# Patient Record
Sex: Female | Born: 1965 | Race: Black or African American | Hispanic: No | Marital: Married | Smoking: Never smoker
Health system: Southern US, Community
[De-identification: ages and names within clinical notes are randomized; demographics above are authoritative.]

## PROBLEM LIST (undated history)

## (undated) DIAGNOSIS — T7840XA Allergy, unspecified, initial encounter: Secondary | ICD-10-CM

## (undated) DIAGNOSIS — G4730 Sleep apnea, unspecified: Secondary | ICD-10-CM

## (undated) DIAGNOSIS — I10 Essential (primary) hypertension: Secondary | ICD-10-CM

## (undated) DIAGNOSIS — E079 Disorder of thyroid, unspecified: Secondary | ICD-10-CM

## (undated) DIAGNOSIS — N189 Chronic kidney disease, unspecified: Secondary | ICD-10-CM

## (undated) HISTORY — PX: BREAST BIOPSY: SHX20

---

## 2007-06-02 ENCOUNTER — Encounter: Admission: RE | Admit: 2007-06-02 | Discharge: 2007-06-02 | Payer: Self-pay | Admitting: Obstetrics and Gynecology

## 2007-07-08 ENCOUNTER — Encounter (INDEPENDENT_AMBULATORY_CARE_PROVIDER_SITE_OTHER): Payer: Self-pay | Admitting: Obstetrics and Gynecology

## 2007-07-08 ENCOUNTER — Ambulatory Visit (HOSPITAL_COMMUNITY): Admission: RE | Admit: 2007-07-08 | Discharge: 2007-07-08 | Payer: Self-pay | Admitting: Obstetrics and Gynecology

## 2008-06-16 ENCOUNTER — Encounter: Admission: RE | Admit: 2008-06-16 | Discharge: 2008-06-16 | Payer: Self-pay | Admitting: Obstetrics and Gynecology

## 2009-07-31 ENCOUNTER — Encounter: Admission: RE | Admit: 2009-07-31 | Discharge: 2009-07-31 | Payer: Self-pay | Admitting: Obstetrics and Gynecology

## 2010-10-23 ENCOUNTER — Encounter
Admission: RE | Admit: 2010-10-23 | Discharge: 2010-10-23 | Payer: Self-pay | Source: Home / Self Care | Attending: Internal Medicine | Admitting: Internal Medicine

## 2011-02-26 NOTE — Op Note (Signed)
NAMETEIRRA, CARAPIA               ACCOUNT NO.:  [id number]   MEDICAL RECORD NO.:  [id number]          PATIENT TYPE:  AMB   LOCATION:  SDC                           FACILITY:  [facility]   PHYSICIAN:  Carrington Clamp, M.D. DATE OF BIRTH:  04/05/1966   DATE OF PROCEDURE:  07/08/2007  DATE OF DISCHARGE:                               OPERATIVE REPORT   PREOPERATIVE DIAGNOSIS:  Menometrorrhagia.   POSTOPERATIVE DIAGNOSIS:  Menometrorrhagia.   PROCEDURE:  Dilation and curettage with hysteroscopy and NovaSure  ablation.   SURGEON:  Carrington Clamp, M.D.  Assistants:  None.   ANESTHESIA:  LMA.   FINDINGS:  Shaggy endometrium.  A 6-cm cavity with a 4.2 cm width.  The  power for the NovaSure ablation was 139 at 55 seconds.  There was  minimal hysteroscopy deficit, i.e., less than 50 mL.   SPECIMENS:  Uterine curettings.   ESTIMATED BLOOD LOSS:  Minimal.   IV FLUIDS:  1000 mL.   URINE OUTPUT:  Not measured.   COMPLICATIONS:  None.   TECHNIQUE:  After adequate general anesthesia was achieved, the patient  was prepped and draped in the usual sterile fashion in the dorsal  lithotomy position.  A speculum was placed in the vagina after the  bladder had been emptied with a red rubber catheter and the cervix was  grasped with a single-tooth tenaculum.  The cervix dilated up with Shawnie Pons  dilators after the cervix had been measured at 4 cm.  The hysteroscope  was passed into the uterine cavity and the above findings noted.  Both  ostia were seen but no other abnormalities of the cavity were seen.   A vigorous curetting with a sharp curette was performed to obtain as  much tissue as possible.  This was sent to pathology.  The NovaSure  ablation instrument was then seated into the cavity without complication  with the above measurements.  Once the CO2 cavity assessment test was  passed, the NovaSure proceeded without complication for  a power of 139 for 55 seconds.  The hysteroscope was passed  into the  uterine cavity immediately afterwards and indicated good contact of all  areas of the uterus down to the cervix.  All instruments were then  withdrawn from the vagina.  The patient tolerated the procedure well and  was returned to the recovery room in stable condition.      Carrington Clamp, M.D.  Electronically Signed     MH/MEDQ  D:  07/08/2007  T:  07/08/2007  Job:  [id number]

## 2011-07-25 LAB — URINALYSIS, ROUTINE W REFLEX MICROSCOPIC
Bilirubin Urine: NEGATIVE
Glucose, UA: NEGATIVE
Leukocytes, UA: NEGATIVE
Nitrite: NEGATIVE
Urobilinogen, UA: 0.2
pH: 6

## 2011-07-25 LAB — URINE MICROSCOPIC-ADD ON

## 2011-07-25 LAB — CBC
HCT: 35.8 — ABNORMAL LOW
MCV: 76.5 — ABNORMAL LOW
RBC: 4.68
WBC: 8.4

## 2011-11-22 ENCOUNTER — Other Ambulatory Visit: Payer: Self-pay | Admitting: Internal Medicine

## 2011-11-22 DIAGNOSIS — Z1231 Encounter for screening mammogram for malignant neoplasm of breast: Secondary | ICD-10-CM

## 2011-12-10 ENCOUNTER — Ambulatory Visit: Payer: Self-pay

## 2012-12-08 ENCOUNTER — Other Ambulatory Visit: Payer: Self-pay | Admitting: Internal Medicine

## 2012-12-08 DIAGNOSIS — Z1231 Encounter for screening mammogram for malignant neoplasm of breast: Secondary | ICD-10-CM

## 2013-01-11 ENCOUNTER — Ambulatory Visit
Admission: RE | Admit: 2013-01-11 | Discharge: 2013-01-11 | Disposition: A | Discharge status: Home / Self Care | Payer: BC Managed Care – PPO | Source: Ambulatory Visit | Attending: Internal Medicine | Admitting: Internal Medicine

## 2013-01-11 DIAGNOSIS — Z1231 Encounter for screening mammogram for malignant neoplasm of breast: Secondary | ICD-10-CM

## 2013-01-21 ENCOUNTER — Other Ambulatory Visit (HOSPITAL_COMMUNITY)
Admission: RE | Admit: 2013-01-21 | Discharge: 2013-01-21 | Disposition: A | Discharge status: Home / Self Care | Payer: BC Managed Care – PPO | Source: Ambulatory Visit | Attending: Obstetrics and Gynecology | Admitting: Obstetrics and Gynecology

## 2013-01-21 ENCOUNTER — Other Ambulatory Visit: Payer: Self-pay | Admitting: Obstetrics and Gynecology

## 2013-01-21 DIAGNOSIS — Z1151 Encounter for screening for human papillomavirus (HPV): Secondary | ICD-10-CM | POA: Insufficient documentation

## 2013-01-21 DIAGNOSIS — Z01419 Encounter for gynecological examination (general) (routine) without abnormal findings: Secondary | ICD-10-CM | POA: Insufficient documentation

## 2013-01-21 DIAGNOSIS — N760 Acute vaginitis: Secondary | ICD-10-CM | POA: Insufficient documentation

## 2014-01-19 ENCOUNTER — Other Ambulatory Visit: Payer: Self-pay

## 2014-01-19 DIAGNOSIS — Z1231 Encounter for screening mammogram for malignant neoplasm of breast: Secondary | ICD-10-CM

## 2014-02-18 ENCOUNTER — Encounter (INDEPENDENT_AMBULATORY_CARE_PROVIDER_SITE_OTHER): Payer: Self-pay

## 2014-02-18 ENCOUNTER — Ambulatory Visit
Admission: RE | Admit: 2014-02-18 | Discharge: 2014-02-18 | Disposition: A | Discharge status: Home / Self Care | Payer: BC Managed Care – PPO | Source: Ambulatory Visit

## 2014-02-18 DIAGNOSIS — Z1231 Encounter for screening mammogram for malignant neoplasm of breast: Secondary | ICD-10-CM

## 2014-02-21 ENCOUNTER — Other Ambulatory Visit: Payer: Self-pay | Admitting: Internal Medicine

## 2014-02-21 DIAGNOSIS — R928 Other abnormal and inconclusive findings on diagnostic imaging of breast: Secondary | ICD-10-CM

## 2014-03-08 ENCOUNTER — Ambulatory Visit
Admission: RE | Admit: 2014-03-08 | Discharge: 2014-03-08 | Disposition: A | Discharge status: Home / Self Care | Payer: BC Managed Care – PPO | Source: Ambulatory Visit | Attending: Internal Medicine | Admitting: Internal Medicine

## 2014-03-08 DIAGNOSIS — R928 Other abnormal and inconclusive findings on diagnostic imaging of breast: Secondary | ICD-10-CM

## 2015-01-23 ENCOUNTER — Other Ambulatory Visit: Payer: Self-pay

## 2015-01-23 DIAGNOSIS — Z1231 Encounter for screening mammogram for malignant neoplasm of breast: Secondary | ICD-10-CM

## 2015-03-27 ENCOUNTER — Ambulatory Visit
Admission: RE | Admit: 2015-03-27 | Discharge: 2015-03-27 | Disposition: A | Discharge status: Home / Self Care | Payer: BC Managed Care – PPO | Source: Ambulatory Visit

## 2015-03-27 DIAGNOSIS — Z1231 Encounter for screening mammogram for malignant neoplasm of breast: Secondary | ICD-10-CM

## 2016-03-21 ENCOUNTER — Other Ambulatory Visit: Payer: Self-pay | Admitting: Internal Medicine

## 2016-03-21 DIAGNOSIS — Z1231 Encounter for screening mammogram for malignant neoplasm of breast: Secondary | ICD-10-CM

## 2016-04-04 ENCOUNTER — Ambulatory Visit
Admission: RE | Admit: 2016-04-04 | Discharge: 2016-04-04 | Disposition: A | Discharge status: Home / Self Care | Payer: BC Managed Care – PPO | Source: Ambulatory Visit | Attending: Internal Medicine | Admitting: Internal Medicine

## 2016-04-04 DIAGNOSIS — Z1231 Encounter for screening mammogram for malignant neoplasm of breast: Secondary | ICD-10-CM

## 2016-04-05 ENCOUNTER — Ambulatory Visit: Payer: BC Managed Care – PPO

## 2017-04-18 ENCOUNTER — Other Ambulatory Visit: Payer: Self-pay | Admitting: Internal Medicine

## 2017-04-18 DIAGNOSIS — Z1231 Encounter for screening mammogram for malignant neoplasm of breast: Secondary | ICD-10-CM

## 2017-04-23 ENCOUNTER — Ambulatory Visit
Admission: RE | Admit: 2017-04-23 | Discharge: 2017-04-23 | Disposition: A | Discharge status: Home / Self Care | Payer: BC Managed Care – PPO | Source: Ambulatory Visit | Attending: Internal Medicine | Admitting: Internal Medicine

## 2017-04-23 DIAGNOSIS — Z1231 Encounter for screening mammogram for malignant neoplasm of breast: Secondary | ICD-10-CM

## 2019-05-19 ENCOUNTER — Other Ambulatory Visit: Payer: Self-pay

## 2019-05-19 DIAGNOSIS — Z1231 Encounter for screening mammogram for malignant neoplasm of breast: Secondary | ICD-10-CM

## 2019-07-06 ENCOUNTER — Ambulatory Visit
Admission: RE | Admit: 2019-07-06 | Discharge: 2019-07-06 | Disposition: A | Discharge status: Home / Self Care | Payer: BC Managed Care – PPO | Source: Ambulatory Visit | Attending: Family Medicine | Admitting: Family Medicine

## 2019-07-06 ENCOUNTER — Other Ambulatory Visit: Payer: Self-pay

## 2019-07-06 DIAGNOSIS — Z1231 Encounter for screening mammogram for malignant neoplasm of breast: Secondary | ICD-10-CM

## 2019-07-08 ENCOUNTER — Other Ambulatory Visit: Payer: Self-pay | Admitting: Family Medicine

## 2019-07-08 DIAGNOSIS — R928 Other abnormal and inconclusive findings on diagnostic imaging of breast: Secondary | ICD-10-CM

## 2019-07-14 ENCOUNTER — Other Ambulatory Visit: Payer: Self-pay

## 2019-07-14 ENCOUNTER — Ambulatory Visit
Admission: RE | Admit: 2019-07-14 | Discharge: 2019-07-14 | Disposition: A | Discharge status: Home / Self Care | Payer: BC Managed Care – PPO | Source: Ambulatory Visit | Attending: Family Medicine | Admitting: Family Medicine

## 2019-07-14 DIAGNOSIS — R928 Other abnormal and inconclusive findings on diagnostic imaging of breast: Secondary | ICD-10-CM

## 2019-12-10 ENCOUNTER — Emergency Department (HOSPITAL_BASED_OUTPATIENT_CLINIC_OR_DEPARTMENT_OTHER)
Admission: EM | Admit: 2019-12-10 | Discharge: 2019-12-10 | Disposition: A | Discharge status: Home / Self Care | Payer: BC Managed Care – PPO | Attending: Emergency Medicine | Admitting: Emergency Medicine

## 2019-12-10 ENCOUNTER — Encounter (HOSPITAL_BASED_OUTPATIENT_CLINIC_OR_DEPARTMENT_OTHER): Payer: Self-pay | Admitting: Student

## 2019-12-10 ENCOUNTER — Other Ambulatory Visit: Payer: Self-pay

## 2019-12-10 DIAGNOSIS — R102 Pelvic and perineal pain: Secondary | ICD-10-CM | POA: Insufficient documentation

## 2019-12-10 DIAGNOSIS — I129 Hypertensive chronic kidney disease with stage 1 through stage 4 chronic kidney disease, or unspecified chronic kidney disease: Secondary | ICD-10-CM | POA: Diagnosis not present

## 2019-12-10 DIAGNOSIS — M545 Low back pain: Secondary | ICD-10-CM | POA: Diagnosis present

## 2019-12-10 DIAGNOSIS — U071 COVID-19: Secondary | ICD-10-CM

## 2019-12-10 DIAGNOSIS — N189 Chronic kidney disease, unspecified: Secondary | ICD-10-CM | POA: Diagnosis not present

## 2019-12-10 HISTORY — DX: Disorder of thyroid, unspecified: E07.9

## 2019-12-10 HISTORY — DX: Essential (primary) hypertension: I10

## 2019-12-10 HISTORY — DX: Chronic kidney disease, unspecified: N18.9

## 2019-12-10 HISTORY — DX: Sleep apnea, unspecified: G47.30

## 2019-12-10 HISTORY — DX: Allergy, unspecified, initial encounter: T78.40XA

## 2019-12-10 LAB — PREGNANCY, URINE: Preg Test, Ur: NEGATIVE

## 2019-12-10 LAB — CBC WITH DIFFERENTIAL/PLATELET
Abs Immature Granulocytes: 0.03 10*3/uL (ref 0.00–0.07)
Basophils Absolute: 0 10*3/uL (ref 0.0–0.1)
Basophils Relative: 0 %
Eosinophils Absolute: 0.1 10*3/uL (ref 0.0–0.5)
Eosinophils Relative: 2 %
HCT: 44.3 % (ref 36.0–46.0)
Hemoglobin: 15.7 g/dL — ABNORMAL HIGH (ref 12.0–15.0)
Immature Granulocytes: 1 %
Lymphocytes Relative: 32 %
Lymphs Abs: 2 10*3/uL (ref 0.7–4.0)
MCH: 30.5 pg (ref 26.0–34.0)
MCHC: 35.4 g/dL (ref 30.0–36.0)
MCV: 86 fL (ref 80.0–100.0)
Monocytes Absolute: 0.5 10*3/uL (ref 0.1–1.0)
Monocytes Relative: 9 %
Neutro Abs: 3.5 10*3/uL (ref 1.7–7.7)
Neutrophils Relative %: 56 %
Platelets: 254 10*3/uL (ref 150–400)
RBC: 5.15 MIL/uL — ABNORMAL HIGH (ref 3.87–5.11)
RDW: 13 % (ref 11.5–15.5)
WBC: 6.3 10*3/uL (ref 4.0–10.5)
nRBC: 0 % (ref 0.0–0.2)

## 2019-12-10 LAB — SARS CORONAVIRUS 2 AG (30 MIN TAT): SARS Coronavirus 2 Ag: POSITIVE — AB

## 2019-12-10 LAB — URINALYSIS, ROUTINE W REFLEX MICROSCOPIC
Bilirubin Urine: NEGATIVE
Glucose, UA: NEGATIVE mg/dL
Hgb urine dipstick: NEGATIVE
Ketones, ur: NEGATIVE mg/dL
Leukocytes,Ua: NEGATIVE
Nitrite: NEGATIVE
Protein, ur: NEGATIVE mg/dL
Specific Gravity, Urine: 1.015 (ref 1.005–1.030)
pH: 6.5 (ref 5.0–8.0)

## 2019-12-10 LAB — COMPREHENSIVE METABOLIC PANEL
ALT: 31 U/L (ref 0–44)
AST: 32 U/L (ref 15–41)
Albumin: 3.8 g/dL (ref 3.5–5.0)
Alkaline Phosphatase: 77 U/L (ref 38–126)
Anion gap: 11 (ref 5–15)
BUN: 16 mg/dL (ref 6–20)
CO2: 25 mmol/L (ref 22–32)
Calcium: 8.7 mg/dL — ABNORMAL LOW (ref 8.9–10.3)
Chloride: 100 mmol/L (ref 98–111)
Creatinine, Ser: 1.11 mg/dL — ABNORMAL HIGH (ref 0.44–1.00)
GFR calc Af Amer: >60 mL/min (ref >60)
GFR calc non Af Amer: 57 mL/min — ABNORMAL LOW (ref >60)
Glucose, Bld: 100 mg/dL — ABNORMAL HIGH (ref 70–99)
Potassium: 2.9 mmol/L — ABNORMAL LOW (ref 3.5–5.1)
Sodium: 136 mmol/L (ref 135–145)
Total Bilirubin: 1 mg/dL (ref 0.3–1.2)
Total Protein: 7.6 g/dL (ref 6.5–8.1)

## 2019-12-10 MED ORDER — ACETAMINOPHEN 325 MG PO TABS
650.0000 mg | ORAL_TABLET | Freq: Once | ORAL | Status: AC
  Administered 2019-12-10: 650 mg via ORAL
  Filled 2019-12-10: qty 2

## 2019-12-10 MED ORDER — POTASSIUM CHLORIDE CRYS ER 20 MEQ PO TBCR: 20.0000 meq | EXTENDED_RELEASE_TABLET | Freq: Every day | ORAL | 0 refills | Status: AC

## 2019-12-10 MED ORDER — ONDANSETRON HCL 4 MG/2ML IJ SOLN
4.0000 mg | Freq: Once | INTRAMUSCULAR | Status: AC
  Administered 2019-12-10: 4 mg via INTRAVENOUS
  Filled 2019-12-10: qty 2

## 2019-12-10 MED ORDER — METHOCARBAMOL 500 MG PO TABS: 500.0000 mg | ORAL_TABLET | Freq: Three times a day (TID) | ORAL | 0 refills | Status: AC | PRN

## 2019-12-10 MED ORDER — POTASSIUM CHLORIDE CRYS ER 20 MEQ PO TBCR
40.0000 meq | EXTENDED_RELEASE_TABLET | Freq: Once | ORAL | Status: AC
  Administered 2019-12-10: 12:00:00 40 meq via ORAL
  Filled 2019-12-10: qty 2

## 2019-12-10 MED ORDER — SODIUM CHLORIDE 0.9 % IV BOLUS
500.0000 mL | Freq: Once | INTRAVENOUS | Status: AC
  Administered 2019-12-10: 11:00:00 500 mL via INTRAVENOUS

## 2019-12-10 MED ORDER — ONDANSETRON 4 MG PO TBDP: 4.0000 mg | ORAL_TABLET | Freq: Three times a day (TID) | ORAL | 0 refills | Status: AC | PRN

## 2019-12-10 NOTE — ED Provider Notes (Signed)
[facility] EMERGENCY DEPARTMENT Provider Note   CSN: [id number] Arrival date & time: 12/10/19  8099     History Chief Complaint  Patient presents with  . Back Pain  . Chills    Anna Steele is a 54 y.o. female with a history of CKD, hypertension, sleep apnea, & thyroid disease who presents to the ED with complaints of back pain x 1 week. Patient states pain is constant to her lower back and also is having intermittent to her suprapubic abdomen. Abdominal discomfort feels like pressure. Associated sxs include dysuria, nausea, subjective fevers, & chills. No other alleviating/aggravating factors. Recent sick contact with her mother & father who are both positive for COVID 65, she has had recent negative test.  Denies URI symptoms, chest pain, dyspnea, cough, emesis, diarrhea, melena, hematochezia, vaginal bleeding, or vaginal discharge.  She is sexually active in a monogamous relationship and is not concerned for STD.  Denies numbness, tingling, weakness, incontinence, history of cancer, or history of IVDU.  HPI     History reviewed. No pertinent past medical history.  There are no problems to display for this patient.   History reviewed. No pertinent surgical history.   OB History   No obstetric history on file.     History reviewed. No pertinent family history.  Social History   Tobacco Use  . Smoking status: Not on file  Substance Use Topics  . Alcohol use: Not on file  . Drug use: Not on file    Home Medications Prior to Admission medications   Not on File    Allergies    Patient has no allergy information on record.  Review of Systems   Review of Systems  Constitutional: Positive for chills and fever.  Respiratory: Negative for cough and shortness of breath.   Cardiovascular: Negative for chest pain.  Gastrointestinal: Positive for abdominal pain and nausea. Negative for anal bleeding, blood in stool, constipation, diarrhea and vomiting.    Genitourinary: Positive for dysuria. Negative for vaginal bleeding and vaginal discharge.  Musculoskeletal: Positive for back pain.  Neurological: Negative for syncope, weakness and numbness.       Negative for incontinence or saddle anesthesia.  All other systems reviewed and are negative.   Physical Exam Updated Vital Signs BP (!) 145/84 (BP Location: Right Arm)   Pulse 87   Temp 99.5 F (37.5 C) (Oral)   Resp 18   Ht 5\' 8"  (1.727 m)   Wt 105.7 kg   SpO2 99%   BMI 35.43 kg/m   Physical Exam Vitals and nursing note reviewed.  Constitutional:      General: She is not in acute distress.    Appearance: She is well-developed. She is not toxic-appearing.  HENT:     Head: Normocephalic and atraumatic.  Eyes:     General:        Right eye: No discharge.        Left eye: No discharge.     Conjunctiva/sclera: Conjunctivae normal.  Cardiovascular:     Rate and Rhythm: Normal rate and regular rhythm.  Pulmonary:     Effort: Pulmonary effort is normal. No respiratory distress.     Breath sounds: Normal breath sounds. No wheezing, rhonchi or rales.  Abdominal:     General: There is no distension.     Palpations: Abdomen is soft.     Tenderness: There is abdominal tenderness (mild lower). There is no guarding or rebound.  Musculoskeletal:     Cervical  back: Normal range of motion and neck supple. No spinous process tenderness or muscular tenderness.     Comments: No obvious deformity, appreciable swelling, erythema, ecchymosis, significant open wounds, or increased warmth.  Extremities: Normal ROM. Nontender.  Back: No point/focal vertebral tenderness, no palpable step off or crepitus. Bilateral lumbar paraspinal muscles.   Skin:    General: Skin is warm and dry.     Findings: No rash.  Neurological:     Mental Status: She is alert.     Deep Tendon Reflexes:     Reflex Scores:      Patellar reflexes are 2+ on the right side and 2+ on the left side.    Comments: Sensation  grossly intact to bilateral lower extremities. 5/5 symmetric strength with plantar/dorsiflexion bilaterally. Gait is intact without obvious foot drop.   Psychiatric:        Behavior: Behavior normal.     ED Results / Procedures / Treatments   Labs (all labs ordered are listed, but only abnormal results are displayed) Labs Reviewed  SARS CORONAVIRUS 2 AG (30 MIN TAT) - Abnormal; Notable for the following components:      Result Value   SARS Coronavirus 2 Ag POSITIVE (*)    All other components within normal limits  COMPREHENSIVE METABOLIC PANEL - Abnormal; Notable for the following components:   Potassium 2.9 (*)    Glucose, Bld 100 (*)    Creatinine, Ser 1.11 (*)    Calcium 8.7 (*)    GFR calc non Af Amer 57 (*)    All other components within normal limits  CBC WITH DIFFERENTIAL/PLATELET - Abnormal; Notable for the following components:   RBC 5.15 (*)    Hemoglobin 15.7 (*)    All other components within normal limits  URINALYSIS, ROUTINE W REFLEX MICROSCOPIC  PREGNANCY, URINE    EKG EKG Interpretation  Date/Time:  Friday December 10 2019 12:39:01 EST Ventricular Rate:  74 PR Interval:    QRS Duration: 94 QT Interval:  454 QTC Calculation: 504 R Axis:   43 Text Interpretation: Sinus rhythm Borderline prolonged QT interval Confirmed by Virgina Norfolk [phone]) on 12/10/2019 12:40:49 PM   Radiology No results found.  Procedures Procedures (including critical care time)  Medications Ordered in ED Medications  sodium chloride 0.9 % bolus 500 mL ( Intravenous Stopped 12/10/19 1208)  ondansetron (ZOFRAN) injection 4 mg (4 mg Intravenous Given 12/10/19 1049)  acetaminophen (TYLENOL) tablet 650 mg (650 mg Oral Given 12/10/19 1042)  potassium chloride SA (KLOR-CON) CR tablet 40 mEq (40 mEq Oral Given 12/10/19 1215)    ED Course  I have reviewed the triage vital signs and the nursing notes.  Pertinent labs & imaging results that were available during my care of the patient  were reviewed by me and considered in my medical decision making (see chart for details).    Anna Steele was evaluated in Emergency Department on 12/10/2019 for the symptoms described in the history of present illness. He/she was evaluated in the context of the global COVID-19 pandemic, which necessitated consideration that the patient might be at risk for infection with the SARS-CoV-2 virus that causes COVID-19. Institutional protocols and algorithms that pertain to the evaluation of patients at risk for COVID-19 are in a state of rapid change based on information released by regulatory bodies including the CDC and federal and state organizations. These policies and algorithms were followed during the patient's care in the ED.  MDM Rules/Calculators/A&P  Patient presents to the ED with complaints of back pain x 1 week.  Patient is nontoxic, no apparent distress, vitals WNL with the exception of elevated BP doubt HTN emergency, normalized with repeat vitals. Exam with bilateral lumbar paraspinal muscle tenderness & mild lower abdominal tenderness. No peritoneal signs. COVID testing positive. Labs reviewed & interpreted- notable for hypokalemia, subsequent EKG with mildly prolonged QTc @ 504- oral replacement, no other significant electrolyte derangement, mild elevation in creatinine compared to prior labs on chart review (most recently 0.99). No anemia/leukocytosis, mild elevation in hgb/hct. UA w/o infection. Offered pelvic exam given mild dysuria- patient declined. Repeat abdominal exams remain w/o peritoneal signs- doubt acute surgical abdomen. No neuro deficits related to back pain to raise concern for cord compression or cauda equina. Given COVID positive w/ subjective fevers & no prior IVDU doubt epidural abscess. No hematuria to raise concern for nephrolithiasis. Overall suspect sxs related to COVID 19, no respiratory distress, appears appropriate for discharge home with  supportive management as she is feeling better & tolerating PO. I discussed results, treatment plan, need for follow-up, and return precautions with the patient. Provided opportunity for questions, patient confirmed understanding and is in agreement with plan.     Final Clinical Impression(s) / ED Diagnoses Final diagnoses:  COVID-19 virus infection    Rx / DC Orders ED Discharge Orders         Ordered    ondansetron (ZOFRAN ODT) 4 MG disintegrating tablet  Every 8 hours PRN     12/10/19 1314    methocarbamol (ROBAXIN) 500 MG tablet  Every 8 hours PRN     12/10/19 1314    potassium chloride SA (KLOR-CON) 20 MEQ tablet  Daily     12/10/19 1314           Kalli Greenfield, [place] R, PA-C 12/10/19 1319    Lennice Sites, DO 12/10/19 1350

## 2019-12-10 NOTE — ED Notes (Signed)
ED Provider at bedside. 

## 2019-12-10 NOTE — ED Triage Notes (Signed)
Pt arrives to ED with c/o lower back pain and chills reports that she was around her mother who was positive for Covid. States that she did have  negative Covid test but continues to have symptoms. Pt also reports NV. Pt also reports recently being told that she has CKD.

## 2019-12-10 NOTE — Discharge Instructions (Addendum)
You were seen in the emergency department today for back pain, abdominal pain, and chills.  Your urine did not show signs of infection.  Your labs showed that your kidney function is mildly elevated and that your potassium was somewhat low.  We are sending you home with potassium tablets to take to supplement this.  Your urine did not show signs of infection.  Your Covid test was positive.  We are sending you home with the following medicines to help with your symptoms: -Potassium tablets: The medicine to help supplement your potassium to take daily for the next few days. -Zofran: This medicine help with nausea and vomiting, take every 8 hours as needed -Robaxin is the muscle relaxer I have prescribed, this is meant to help with muscle tightness. Be aware that this medication may make you drowsy therefore the first time you take this it should be at a time you are in an environment where you can rest. Do not drive or operate heavy machinery when taking this medication. Do not drink alcohol or take other sedating medications with this medicine such as narcotics or benzodiazepines.   You make take Tylenol per over the counter dosing with these medications.   We have prescribed you new medication(s) today. Discuss the medications prescribed today with your pharmacist as they can have adverse effects and interactions with your other medicines including over the counter and prescribed medications. Seek medical evaluation if you start to experience new or abnormal symptoms after taking one of these medicines, seek care immediately if you start to experience difficulty breathing, feeling of your throat closing, facial swelling, or rash as these could be indications of a more serious allergic reaction  We are instructing patient's with COVID 19 or symptoms of COVID 19 to quarantine themselves for 14 days. You may be able to discontinue self quarantine if the following conditions are met:   Persons with COVID-19  who have symptoms and were directed to care for themselves at home may discontinue home isolation under the  following conditions: - It has been at least 7 days have passed since symptoms first appeared. - AND at least 3 days (72 hours) have passed since recovery defined as resolution of fever without the use of fever-reducing medications and improvement in respiratory symptoms (e.g., cough, shortness of breath)  Please follow the below quarantine instructions.   Please follow up with primary care within 3-5 days for re-evaluation- call prior to going to the office to make them aware of your symptoms as some offices are altering their method of seeing patients with COVID 19 symptoms. Return to the ER for new or worsening symptoms including but not limited to increased work of breathing, chest pain, passing out, increased pain, numbness, weakness, inability to keep fluids down, or any other concerns.       Person Under Monitoring Name: Anna Steele  Location: [id number] [id number]   Infection Prevention Recommendations for Individuals Confirmed to have, or Being Evaluated for, 2019 Novel Coronavirus (COVID-19) Infection Who Receive Care at Home  Individuals who are confirmed to have, or are being evaluated for, COVID-19 should follow the prevention steps below until a healthcare provider or local or state health department says they can return to normal activities.  Stay home except to get medical care You should restrict activities outside your home, except for getting medical care. Do not go to work, school, or public areas, and do not use public transportation or taxis.  Call ahead before visiting your doctor Before your medical appointment, call the healthcare provider and tell them that you have, or are being evaluated for, COVID-19 infection. This will help the healthcare provider's office take steps to keep other people from getting infected. Ask your  healthcare provider to call the local or state health department.  Monitor your symptoms Seek prompt medical attention if your illness is worsening (e.g., difficulty breathing). Before going to your medical appointment, call the healthcare provider and tell them that you have, or are being evaluated for, COVID-19 infection. Ask your healthcare provider to call the local or state health department.  Wear a facemask You should wear a facemask that covers your nose and mouth when you are in the same room with other people and when you visit a healthcare provider. People who live with or visit you should also wear a facemask while they are in the same room with you.  Separate yourself from other people in your home As much as possible, you should stay in a different room from other people in your home. Also, you should use a separate bathroom, if available.  Avoid sharing household items You should not share dishes, drinking glasses, cups, eating utensils, towels, bedding, or other items with other people in your home. After using these items, you should wash them thoroughly with soap and water.  Cover your coughs and sneezes Cover your mouth and nose with a tissue when you cough or sneeze, or you can cough or sneeze into your sleeve. Throw used tissues in a lined trash can, and immediately wash your hands with soap and water for at least 20 seconds or use an alcohol-based hand rub.  Wash your [facility] your hands often and thoroughly with soap and water for at least 20 seconds. You can use an alcohol-based hand sanitizer if soap and water are not available and if your hands are not visibly dirty. Avoid touching your eyes, nose, and mouth with unwashed hands.   Prevention Steps for Caregivers and Household Members of Individuals Confirmed to have, or Being Evaluated for, COVID-19 Infection Being Cared for in the Home  If you live with, or provide care at home for, a person confirmed to  have, or being evaluated for, COVID-19 infection please follow these guidelines to prevent infection:  Follow healthcare provider's instructions Make sure that you understand and can help the patient follow any healthcare provider instructions for all care.  Provide for the patient's basic needs You should help the patient with basic needs in the home and provide support for getting groceries, prescriptions, and other personal needs.  Monitor the patient's symptoms If they are getting sicker, call his or her medical provider and tell them that the patient has, or is being evaluated for, COVID-19 infection. This will help the healthcare provider's office take steps to keep other people from getting infected. Ask the healthcare provider to call the local or state health department.  Limit the number of people who have contact with the patient If possible, have only one caregiver for the patient. Other household members should stay in another home or place of residence. If this is not possible, they should stay in another room, or be separated from the patient as much as possible. Use a separate bathroom, if available. Restrict visitors who do not have an essential need to be in the home.  Keep older adults, very young children, and other sick people away from the patient Keep older adults, very  young children, and those who have compromised immune systems or chronic health conditions away from the patient. This includes people with chronic heart, lung, or kidney conditions, diabetes, and cancer.  Ensure good ventilation Make sure that shared spaces in the home have good air flow, such as from an air conditioner or an opened window, weather permitting.  Wash your hands often Wash your hands often and thoroughly with soap and water for at least 20 seconds. You can use an alcohol based hand sanitizer if soap and water are not available and if your hands are not visibly dirty. Avoid touching  your eyes, nose, and mouth with unwashed hands. Use disposable paper towels to dry your hands. If not available, use dedicated cloth towels and replace them when they become wet.  Wear a facemask and gloves Wear a disposable facemask at all times in the room and gloves when you touch or have contact with the patient's blood, body fluids, and/or secretions or excretions, such as sweat, saliva, sputum, nasal mucus, vomit, urine, or feces.  Ensure the mask fits over your nose and mouth tightly, and do not touch it during use. Throw out disposable facemasks and gloves after using them. Do not reuse. Wash your hands immediately after removing your facemask and gloves. If your personal clothing becomes contaminated, carefully remove clothing and launder. Wash your hands after handling contaminated clothing. Place all used disposable facemasks, gloves, and other waste in a lined container before disposing them with other household waste. Remove gloves and wash your hands immediately after handling these items.  Do not share dishes, glasses, or other household items with the patient Avoid sharing household items. You should not share dishes, drinking glasses, cups, eating utensils, towels, bedding, or other items with a patient who is confirmed to have, or being evaluated for, COVID-19 infection. After the person uses these items, you should wash them thoroughly with soap and water.  Wash laundry thoroughly Immediately remove and wash clothes or bedding that have blood, body fluids, and/or secretions or excretions, such as sweat, saliva, sputum, nasal mucus, vomit, urine, or feces, on them. Wear gloves when handling laundry from the patient. Read and follow directions on labels of laundry or clothing items and detergent. In general, wash and dry with the warmest temperatures recommended on the label.  Clean all areas the individual has used often Clean all touchable surfaces, such as counters,  tabletops, doorknobs, bathroom fixtures, toilets, phones, keyboards, tablets, and bedside tables, every day. Also, clean any surfaces that may have blood, body fluids, and/or secretions or excretions on them. Wear gloves when cleaning surfaces the patient has come in contact with. Use a diluted bleach solution (e.g., dilute bleach with 1 part bleach and 10 parts water) or a household disinfectant with a label that says EPA-registered for coronaviruses. To make a bleach solution at home, add 1 tablespoon of bleach to 1 quart (4 cups) of water. For a larger supply, add  cup of bleach to 1 gallon (16 cups) of water. Read labels of cleaning products and follow recommendations provided on product labels. Labels contain instructions for safe and effective use of the cleaning product including precautions you should take when applying the product, such as wearing gloves or eye protection and making sure you have good ventilation during use of the product. Remove gloves and wash hands immediately after cleaning.  Monitor yourself for signs and symptoms of illness Caregivers and household members are considered close contacts, should monitor their health, and will  be asked to limit movement outside of the home to the extent possible. Follow the monitoring steps for close contacts listed on the symptom monitoring form.   ? If you have additional questions, contact your local health department or call the epidemiologist on call at [phone] (available 24/7). ? This guidance is subject to change. For the most up-to-date guidance from [facility], please refer to their website: YouBlogs.pl

## 2019-12-11 ENCOUNTER — Telehealth (HOSPITAL_COMMUNITY): Payer: Self-pay | Admitting: Nurse Practitioner

## 2019-12-11 ENCOUNTER — Telehealth: Payer: Self-pay | Admitting: Adult Health

## 2019-12-11 NOTE — Telephone Encounter (Signed)
Called toDiscuss with patient about SARS-CoV-2 positive test, Covid symptoms and the use of bamlanivimab, a monoclonal antibody infusion for those with mild to moderate Covid symptoms and at a high risk of hospitalization.   Pt is qualified for this infusion at the [facility] infusion center due to co-morbid conditions and/or a member of an at-risk group.   Unable to reach pt. Left message.  MyChart inactive  William Hamburger, NP-C

## 2019-12-11 NOTE — Telephone Encounter (Signed)
Called to Discuss with patient about Covid symptoms and the use of bamlanivimab, a monoclonal antibody infusion for those with mild to moderate Covid symptoms and at a high risk of hospitalization.     Pt is qualified for this infusion at the [facility] infusion center due to co-morbid conditions and/or a member of an at-risk group.     Unable to reach pt. Left message. No mychart.   Tariq Pernell, DNP, AGNP-C [phone] (Infusion Center Hotline)  

## 2019-12-17 ENCOUNTER — Telehealth: Payer: Self-pay | Admitting: Physician Assistant

## 2019-12-17 ENCOUNTER — Telehealth: Payer: Self-pay | Admitting: Adult Health

## 2019-12-17 NOTE — Telephone Encounter (Signed)
Called to discuss with patient about Covid symptoms and the use of bamlanivimab or casirivimab/imdevimab, a monoclonal antibody infusion for those with mild to moderate Covid symptoms and at a high risk of hospitalization.  Message left to call back  Cline Crock PA-C  [facility]

## 2019-12-17 NOTE — Telephone Encounter (Signed)
Called toDiscuss with patient about SARS-CoV-2 positive test, Covid symptoms and the use of bamlanivimab, a monoclonal antibody infusion for those with mild to moderate Covid symptoms and at a high risk of hospitalization.   Pt is qualified for this infusion at the [facility] infusion center due to co-morbid conditions and/or a member of an at-risk group.   Unable to reach pt. Left message- 3rd attempt MyChart inactive  William Hamburger, NP-C

## 2020-11-07 ENCOUNTER — Other Ambulatory Visit: Payer: Self-pay | Admitting: Family Medicine

## 2020-11-07 DIAGNOSIS — Z1231 Encounter for screening mammogram for malignant neoplasm of breast: Secondary | ICD-10-CM

## 2020-12-21 ENCOUNTER — Ambulatory Visit: Payer: BC Managed Care – PPO

## 2021-01-09 ENCOUNTER — Ambulatory Visit
Admission: RE | Admit: 2021-01-09 | Discharge: 2021-01-09 | Disposition: A | Discharge status: Home / Self Care | Payer: BC Managed Care – PPO | Source: Ambulatory Visit | Attending: Family Medicine | Admitting: Family Medicine

## 2021-01-09 ENCOUNTER — Other Ambulatory Visit: Payer: Self-pay

## 2021-01-09 DIAGNOSIS — Z1231 Encounter for screening mammogram for malignant neoplasm of breast: Secondary | ICD-10-CM

## 2021-02-28 IMAGING — US US BREAST*L* LIMITED INC AXILLA
1 series · 5 of 5 positions shown · non-contrast
Comparison: Previous exam(s).

CLINICAL DATA: Screening recall for a possible left breast
asymmetry.

EXAM:
DIGITAL DIAGNOSTIC LEFT MAMMOGRAM WITH CAD AND TOMO
ULTRASOUND LEFT BREAST

[Series 1: us breast*left* limited inc axilla · 0.06mm/px · 5 of 5 slices shown]
[im 1/5]
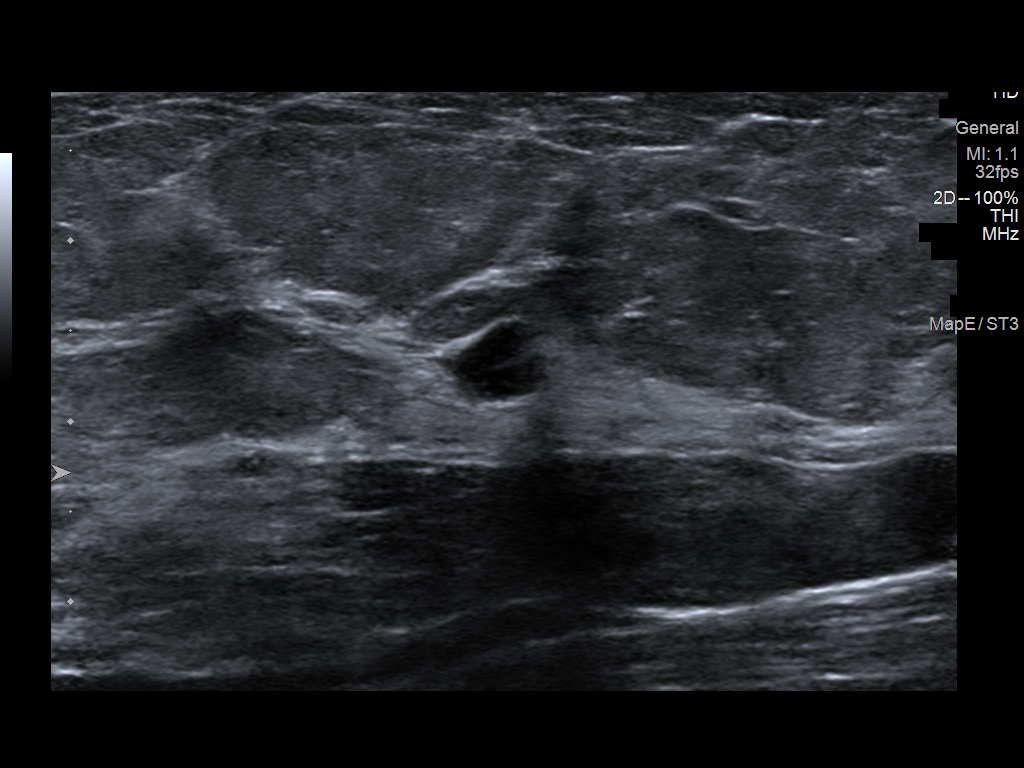
[im 2/5]
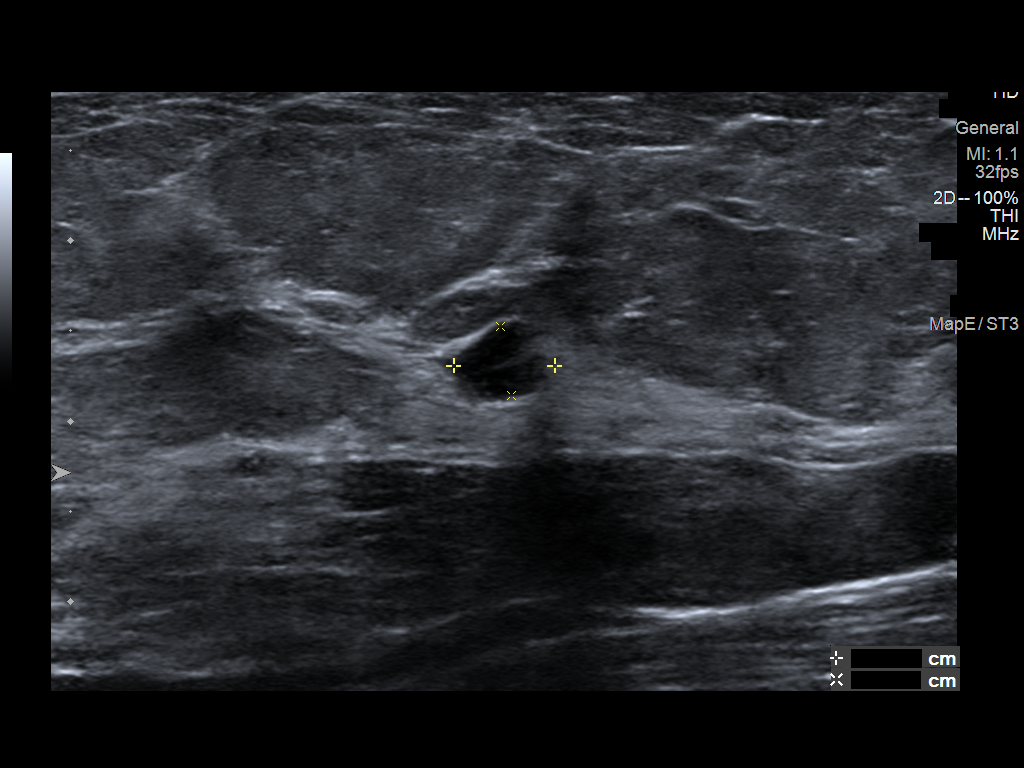
[im 3/5]
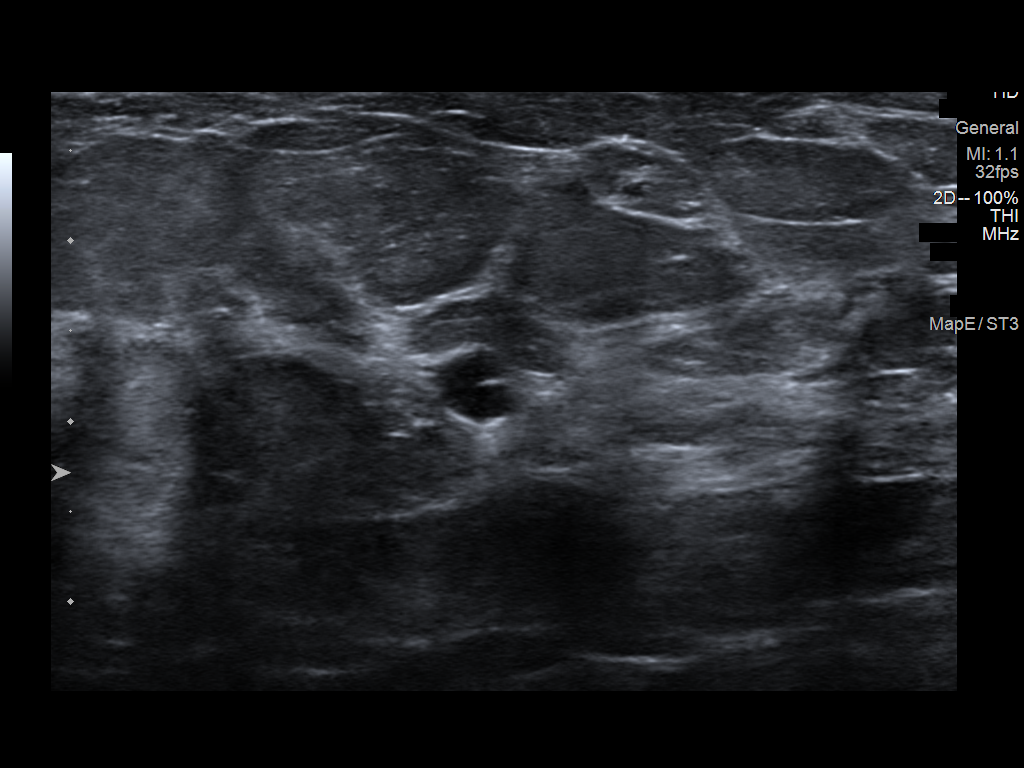
[im 4/5]
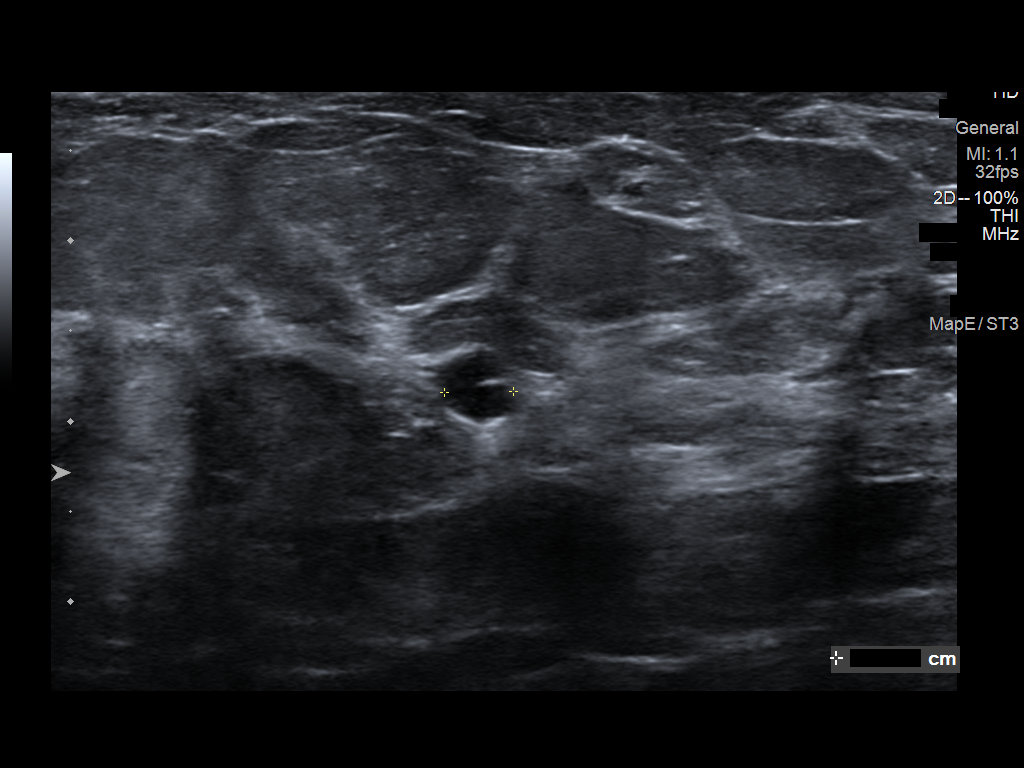
[im 5/5]
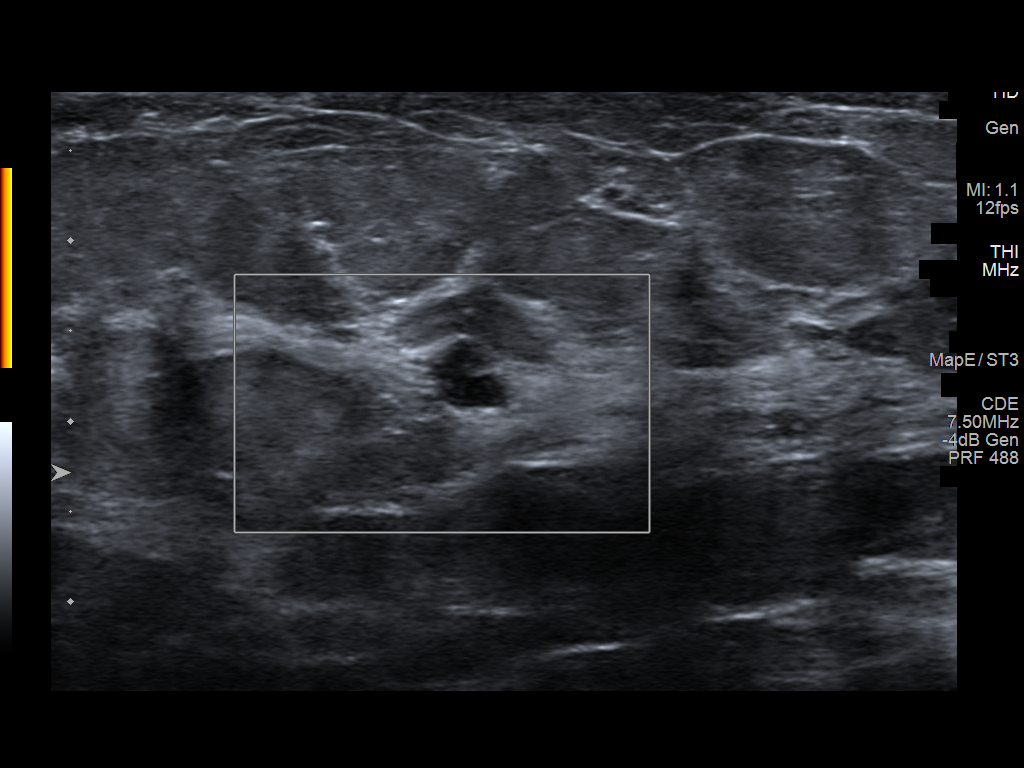

[5 of 5 positions shown; findings below may reference images not displayed]

ACR Breast Density Category b: There are scattered areas of
fibroglandular density.
FINDINGS: The possible asymmetry noted in the upper left breast on the
screening MLO view disperses on spot compression imaging consistent
with superimposed fibroglandular tissue. There is no underlying mass
or significant residual asymmetry. Stable benign calcifications are
noted in the upper left breast. There are no areas of architectural
distortion. There are no new or suspicious calcifications.

Mammographic images were processed with CAD.

On physical exam, no mass is palpated in the upper outer left
breast.

Targeted ultrasound is performed, showing normal fibroglandular
tissue throughout the upper outer left breast. A cyst is also noted
at 1 o'clock, 5 cm the nipple, middle to posterior depth, measuring
6 x 4 x 4 mm. No solid masses or suspicious lesions.
IMPRESSION: 1. No evidence of left breast malignancy.
2. Small benign cyst in the upper outer left breast.

RECOMMENDATION:
Screening mammogram in one year.(Code:[id number])

I have discussed the findings and recommendations with the patient.
If applicable, a reminder letter will be sent to the patient
regarding the next appointment.

BI-RADS CATEGORY  2: Benign.

## 2022-02-22 ENCOUNTER — Other Ambulatory Visit: Payer: Self-pay | Admitting: Family Medicine

## 2022-02-22 DIAGNOSIS — Z1231 Encounter for screening mammogram for malignant neoplasm of breast: Secondary | ICD-10-CM

## 2022-03-06 ENCOUNTER — Ambulatory Visit
Admission: RE | Admit: 2022-03-06 | Discharge: 2022-03-06 | Disposition: A | Discharge status: Home / Self Care | Payer: BC Managed Care – PPO | Source: Ambulatory Visit | Attending: Family Medicine | Admitting: Family Medicine

## 2022-03-06 ENCOUNTER — Ambulatory Visit: Payer: BC Managed Care – PPO

## 2022-03-06 DIAGNOSIS — Z1231 Encounter for screening mammogram for malignant neoplasm of breast: Secondary | ICD-10-CM

## 2022-03-08 ENCOUNTER — Other Ambulatory Visit: Payer: Self-pay | Admitting: Family Medicine

## 2022-03-08 DIAGNOSIS — R928 Other abnormal and inconclusive findings on diagnostic imaging of breast: Secondary | ICD-10-CM

## 2022-04-11 ENCOUNTER — Ambulatory Visit
Admission: RE | Admit: 2022-04-11 | Discharge: 2022-04-11 | Disposition: A | Discharge status: Home / Self Care | Payer: BC Managed Care – PPO | Source: Ambulatory Visit | Attending: Family Medicine | Admitting: Family Medicine

## 2022-04-11 DIAGNOSIS — R928 Other abnormal and inconclusive findings on diagnostic imaging of breast: Secondary | ICD-10-CM

## 2023-03-11 ENCOUNTER — Other Ambulatory Visit: Payer: Self-pay | Admitting: Family Medicine

## 2023-03-11 DIAGNOSIS — Z1231 Encounter for screening mammogram for malignant neoplasm of breast: Secondary | ICD-10-CM

## 2023-03-17 ENCOUNTER — Ambulatory Visit
Admission: RE | Admit: 2023-03-17 | Discharge: 2023-03-17 | Disposition: A | Discharge status: Home / Self Care | Payer: BC Managed Care – PPO | Source: Ambulatory Visit | Attending: Family Medicine | Admitting: Family Medicine

## 2023-03-17 DIAGNOSIS — Z1231 Encounter for screening mammogram for malignant neoplasm of breast: Secondary | ICD-10-CM

## 2024-05-14 ENCOUNTER — Other Ambulatory Visit: Payer: Self-pay | Admitting: Family Medicine

## 2024-05-14 DIAGNOSIS — Z1231 Encounter for screening mammogram for malignant neoplasm of breast: Secondary | ICD-10-CM

## 2024-06-01 ENCOUNTER — Ambulatory Visit
Admission: RE | Admit: 2024-06-01 | Discharge: 2024-06-01 | Disposition: A | Discharge status: Home / Self Care | Payer: Self-pay | Source: Ambulatory Visit | Attending: Family Medicine | Admitting: Family Medicine

## 2024-06-01 DIAGNOSIS — Z1231 Encounter for screening mammogram for malignant neoplasm of breast: Secondary | ICD-10-CM
